# Patient Record
Sex: Female | Born: 2000
Health system: Southern US, Community
[De-identification: ages and names within clinical notes are randomized; demographics above are authoritative.]

## PROBLEM LIST (undated history)

## (undated) DIAGNOSIS — IMO0001 Reserved for inherently not codable concepts without codable children: Secondary | ICD-10-CM

## (undated) DIAGNOSIS — Z889 Allergy status to unspecified drugs, medicaments and biological substances status: Secondary | ICD-10-CM

## (undated) DIAGNOSIS — N83209 Unspecified ovarian cyst, unspecified side: Secondary | ICD-10-CM

## (undated) HISTORY — PX: NO PAST SURGERIES: SHX2092

## (undated) HISTORY — DX: Reserved for inherently not codable concepts without codable children: IMO0001

---
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
## 2020-01-04 ENCOUNTER — Emergency Department (HOSPITAL_BASED_OUTPATIENT_CLINIC_OR_DEPARTMENT_OTHER)
Admission: EM | Admit: 2020-01-04 | Discharge: 2020-01-04 | Disposition: A | Discharge status: Home / Self Care | Payer: Federal, State, Local not specified - PPO | Attending: Emergency Medicine | Admitting: Emergency Medicine

## 2020-01-04 ENCOUNTER — Other Ambulatory Visit: Payer: Self-pay

## 2020-01-04 ENCOUNTER — Encounter (HOSPITAL_BASED_OUTPATIENT_CLINIC_OR_DEPARTMENT_OTHER): Payer: Self-pay | Admitting: Emergency Medicine

## 2020-01-04 DIAGNOSIS — Z4802 Encounter for removal of sutures: Secondary | ICD-10-CM | POA: Insufficient documentation

## 2020-01-04 DIAGNOSIS — S0181XD Laceration without foreign body of other part of head, subsequent encounter: Secondary | ICD-10-CM | POA: Diagnosis not present

## 2020-01-04 DIAGNOSIS — X58XXXA Exposure to other specified factors, initial encounter: Secondary | ICD-10-CM | POA: Diagnosis not present
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
## 2020-01-04 NOTE — Discharge Instructions (Signed)
You were seen in the emergency department today to have your stitches removed.  The wound appears to be healing well.  Please keep it covered when in the sun to help prevent scarring.  You may utilize a bandage and over-the-counter Neosporin antibiotic ointment to help with this.  Please follow-up with your primary care provider within 1 week for recheck of your wound.  Return to the emergency department for new or worsening symptoms including but not limited to redness, drainage, warmth, fever, or any other concerns.

## 2020-01-04 NOTE — ED Triage Notes (Signed)
Pt requesting suture removal from chin

## 2020-04-10 DIAGNOSIS — Z68.41 Body mass index (BMI) pediatric, 5th percentile to less than 85th percentile for age: Secondary | ICD-10-CM | POA: Diagnosis not present

## 2020-04-10 DIAGNOSIS — Z713 Dietary counseling and surveillance: Secondary | ICD-10-CM | POA: Diagnosis not present

## 2020-04-10 DIAGNOSIS — Z7189 Other specified counseling: Secondary | ICD-10-CM | POA: Diagnosis not present

## 2020-04-10 DIAGNOSIS — Z00129 Encounter for routine child health examination without abnormal findings: Secondary | ICD-10-CM | POA: Diagnosis not present

## 2020-06-11 DIAGNOSIS — Z23 Encounter for immunization: Secondary | ICD-10-CM | POA: Diagnosis not present

## 2020-06-17 DIAGNOSIS — N926 Irregular menstruation, unspecified: Secondary | ICD-10-CM | POA: Diagnosis not present

## 2020-06-17 DIAGNOSIS — Z113 Encounter for screening for infections with a predominantly sexual mode of transmission: Secondary | ICD-10-CM | POA: Diagnosis not present

## 2020-08-05 DIAGNOSIS — M9903 Segmental and somatic dysfunction of lumbar region: Secondary | ICD-10-CM | POA: Diagnosis not present

## 2020-08-05 DIAGNOSIS — M62830 Muscle spasm of back: Secondary | ICD-10-CM | POA: Diagnosis not present

## 2020-08-05 DIAGNOSIS — M9913 Subluxation complex (vertebral) of lumbar region: Secondary | ICD-10-CM | POA: Diagnosis not present

## 2020-08-05 DIAGNOSIS — M545 Low back pain: Secondary | ICD-10-CM | POA: Diagnosis not present

## 2020-08-18 ENCOUNTER — Other Ambulatory Visit: Payer: Federal, State, Local not specified - PPO

## 2020-08-18 DIAGNOSIS — Z20822 Contact with and (suspected) exposure to covid-19: Secondary | ICD-10-CM | POA: Diagnosis not present
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
## 2020-08-20 DIAGNOSIS — M545 Low back pain: Secondary | ICD-10-CM | POA: Diagnosis not present

## 2020-08-20 DIAGNOSIS — M9913 Subluxation complex (vertebral) of lumbar region: Secondary | ICD-10-CM | POA: Diagnosis not present

## 2020-08-20 DIAGNOSIS — M9903 Segmental and somatic dysfunction of lumbar region: Secondary | ICD-10-CM | POA: Diagnosis not present

## 2020-08-20 DIAGNOSIS — M62830 Muscle spasm of back: Secondary | ICD-10-CM | POA: Diagnosis not present

## 2020-08-20 LAB — NOVEL CORONAVIRUS, NAA: SARS-CoV-2, NAA: NOT DETECTED

## 2020-08-20 LAB — SARS-COV-2, NAA 2 DAY TAT

## 2020-12-25 DIAGNOSIS — H18622 Keratoconus, unstable, left eye: Secondary | ICD-10-CM | POA: Diagnosis not present

## 2021-02-17 DIAGNOSIS — R1030 Lower abdominal pain, unspecified: Secondary | ICD-10-CM | POA: Diagnosis not present

## 2021-02-17 DIAGNOSIS — R112 Nausea with vomiting, unspecified: Secondary | ICD-10-CM | POA: Diagnosis not present

## 2021-02-23 DIAGNOSIS — L304 Erythema intertrigo: Secondary | ICD-10-CM | POA: Diagnosis not present

## 2021-03-23 DIAGNOSIS — L304 Erythema intertrigo: Secondary | ICD-10-CM | POA: Diagnosis not present

## 2021-03-23 DIAGNOSIS — Z3046 Encounter for surveillance of implantable subdermal contraceptive: Secondary | ICD-10-CM | POA: Diagnosis not present

## 2021-03-23 DIAGNOSIS — Z113 Encounter for screening for infections with a predominantly sexual mode of transmission: Secondary | ICD-10-CM | POA: Diagnosis not present

## 2021-03-23 DIAGNOSIS — Z01419 Encounter for gynecological examination (general) (routine) without abnormal findings: Secondary | ICD-10-CM | POA: Diagnosis not present

## 2021-04-02 DIAGNOSIS — H18621 Keratoconus, unstable, right eye: Secondary | ICD-10-CM | POA: Diagnosis not present

## 2021-04-09 DIAGNOSIS — H18622 Keratoconus, unstable, left eye: Secondary | ICD-10-CM | POA: Diagnosis not present

## 2021-07-23 DIAGNOSIS — H18621 Keratoconus, unstable, right eye: Secondary | ICD-10-CM | POA: Diagnosis not present

## 2021-11-05 ENCOUNTER — Other Ambulatory Visit: Payer: Self-pay

## 2021-11-05 ENCOUNTER — Ambulatory Visit (HOSPITAL_COMMUNITY)
Admission: EM | Admit: 2021-11-05 | Discharge: 2021-11-05 | Disposition: A | Discharge status: Home / Self Care | Payer: Federal, State, Local not specified - PPO | Attending: Physician Assistant | Admitting: Physician Assistant

## 2021-11-05 ENCOUNTER — Encounter (HOSPITAL_COMMUNITY): Payer: Self-pay

## 2021-11-05 DIAGNOSIS — N921 Excessive and frequent menstruation with irregular cycle: Secondary | ICD-10-CM | POA: Insufficient documentation

## 2021-11-05 LAB — COMPREHENSIVE METABOLIC PANEL
ALT: 11 U/L (ref 0–44)
AST: 13 U/L — ABNORMAL LOW (ref 15–41)
Albumin: 3.9 g/dL (ref 3.5–5.0)
Alkaline Phosphatase: 52 U/L (ref 38–126)
Anion gap: 5 (ref 5–15)
BUN: 6 mg/dL (ref 6–20)
CO2: 26 mmol/L (ref 22–32)
Calcium: 9.3 mg/dL (ref 8.9–10.3)
Chloride: 105 mmol/L (ref 98–111)
Creatinine, Ser: 0.88 mg/dL (ref 0.44–1.00)
GFR, Estimated: >60 mL/min (ref >60)
Glucose, Bld: 93 mg/dL (ref 70–99)
Potassium: 4.2 mmol/L (ref 3.5–5.1)
Sodium: 136 mmol/L (ref 135–145)
Total Bilirubin: 0.8 mg/dL (ref 0.3–1.2)
Total Protein: 7.2 g/dL (ref 6.5–8.1)

## 2021-11-05 LAB — CBC WITH DIFFERENTIAL/PLATELET
Abs Immature Granulocytes: 0.02 10*3/uL (ref 0.00–0.07)
Basophils Absolute: 0 10*3/uL (ref 0.0–0.1)
Basophils Relative: 0 %
Eosinophils Absolute: 0.1 10*3/uL (ref 0.0–0.5)
Eosinophils Relative: 1 %
HCT: 41.2 % (ref 36.0–46.0)
Hemoglobin: 13 g/dL (ref 12.0–15.0)
Immature Granulocytes: 0 %
Lymphocytes Relative: 20 %
Lymphs Abs: 1.6 10*3/uL (ref 0.7–4.0)
MCH: 25.7 pg — ABNORMAL LOW (ref 26.0–34.0)
MCHC: 31.6 g/dL (ref 30.0–36.0)
MCV: 81.6 fL (ref 80.0–100.0)
Monocytes Absolute: 0.7 10*3/uL (ref 0.1–1.0)
Monocytes Relative: 9 %
Neutro Abs: 5.6 10*3/uL (ref 1.7–7.7)
Neutrophils Relative %: 70 %
Platelets: 317 10*3/uL (ref 150–400)
RBC: 5.05 MIL/uL (ref 3.87–5.11)
RDW: 13.8 % (ref 11.5–15.5)
WBC: 8 10*3/uL (ref 4.0–10.5)
nRBC: 0 % (ref 0.0–0.2)

## 2021-11-05 LAB — TSH: TSH: 1.845 u[IU]/mL (ref 0.350–4.500)

## 2021-11-05 LAB — POC URINE PREG, ED: Preg Test, Ur: NEGATIVE

## 2021-11-05 NOTE — Discharge Instructions (Signed)
Your urine pregnancy test was negative.  It is not uncommon to have irregular menstrual cycles and bleeding with Nexplanon.  I recommend that you follow-up with OB/GYN as soon as possible as we discussed.  We will contact you with your lab results if we need to arrange any treatment.  If you develop any heavy bleeding where you are having to change your personal hygiene products every 1-2 hours you need to go to the emergency room.  If you develop any lightheadedness, chest pain, passing out, shortness of breath, severe fatigue you need to be seen immediately.

## 2021-11-05 NOTE — ED Provider Notes (Signed)
[facility]    CSN: [id number] Arrival date & time: 11/05/21  1330      History   Chief Complaint Chief Complaint  Patient presents with   Vaginal Bleeding    STI testing     HPI Shannon Arias is a 20 y.o. female.   Patient presents today with a 2 to 3-day history of abnormal uterine bleeding.  Reports that her last menstrual cycle was approximately 10/20/2021 and stopped 10/27/2021.  She then had several days without bleeding until recurrence a few days ago.  Reports she is bleeding heavily and having to replace pad/tampon a few times per day.  She is not bleeding to the point that she needs to replace personal hygiene products every 1-2 hours.  She does have a Nexplanon in place which has been in place for several years.  She reports having irregular menstrual cycle since placement of this device several years ago.  She denies any vaginal pain, discharge, fever, abdominal pain, nausea, vomiting.  She is interested in STI testing.  She denies any history of bleeding disorder and does not take any blood thinning medication.  She denies any chest pain, shortness of breath, lightheadedness.   Past Medical History:  Diagnosis Date   H/O seasonal allergies    Healthy pediatric patient    Ovarian cyst     Patient Active Problem List   Diagnosis Date Noted   Bell's palsy 07/29/2015    Past Surgical History:  Procedure Laterality Date   NO PAST SURGERIES      OB History   No obstetric history on file.      Home Medications    Prior to Admission medications   Medication Sig Start Date End Date Taking? Authorizing Provider  acyclovir (ZOVIRAX) 400 MG tablet Take 400 mg by mouth daily. Reported on 02/01/2016 07/24/15   Maryellen Pile, MD  Multiple Vitamin (MULTIVITAMIN) tablet Take 1 tablet by mouth daily. Reported on 02/01/2016    [provider]  ondansetron (ZOFRAN ODT) 4 MG disintegrating tablet Take 1 tablet (4 mg total) by mouth every 8 (eight) hours  as needed for nausea or vomiting. 08/30/19   Long, Arlyss Repress, MD  ranitidine (ZANTAC) 150 MG tablet Take 150 mg by mouth 2 (two) times daily. Reported on 02/01/2016 07/24/15   Maryellen Pile, MD    Family History Family History  Problem Relation Age of Onset   Seizures Maternal Grandmother    Stroke Neg Hx    Ataxia Neg Hx    Chorea Neg Hx    Dementia Neg Hx    Mental retardation Neg Hx    Migraines Neg Hx    Multiple sclerosis Neg Hx    Neurofibromatosis Neg Hx    Neuropathy Neg Hx    Parkinsonism Neg Hx     Social History Social History   Tobacco Use   Smoking status: Never   Smokeless tobacco: Never  Vaping Use   Vaping Use: Never used  Substance Use Topics   Alcohol use: No   Drug use: Yes    Types: Marijuana     Allergies   Patient has no known allergies.   Review of Systems Review of Systems  Constitutional:  Positive for activity change. Negative for appetite change, fatigue and fever.  Respiratory:  Negative for cough and shortness of breath.   Cardiovascular:  Negative for chest pain.  Gastrointestinal:  Negative for abdominal pain, diarrhea, nausea and vomiting.  Genitourinary:  Positive for menstrual  problem and vaginal bleeding. Negative for vaginal discharge and vaginal pain.  Neurological:  Negative for dizziness, light-headedness and headaches.    Physical Exam Triage Vital Signs ED Triage Vitals [11/05/21 1422]  Enc Vitals Group     BP 124/87     Pulse Rate 87     Resp 18     Temp 98.7 F (37.1 C)     Temp src      SpO2 100 %     Weight      Height      Head Circumference      Peak Flow      Pain Score      Pain Loc      Pain Edu?      Excl. in GC?    No data found.  Updated Vital Signs BP 124/87 (BP Location: Left Arm)    Pulse 87    Temp 98.7 F (37.1 C)    Resp 18    SpO2 100%   Visual Acuity Right Eye Distance:   Left Eye Distance:   Bilateral Distance:    Right Eye Near:   Left Eye Near:    Bilateral Near:     Physical  Exam Vitals reviewed.  Constitutional:      General: She is awake. She is not in acute distress.    Appearance: Normal appearance. She is well-developed. She is not ill-appearing.     Comments: Very pleasant female appears stated age in no acute distress sitting comfortably in exam room  HENT:     Head: Normocephalic and atraumatic.  Cardiovascular:     Rate and Rhythm: Normal rate and regular rhythm.     Heart sounds: Normal heart sounds, S1 normal and S2 normal. No murmur heard. Pulmonary:     Effort: Pulmonary effort is normal.     Breath sounds: Normal breath sounds. No wheezing, rhonchi or rales.     Comments: Clear to auscultation bilaterally Abdominal:     General: Bowel sounds are normal.     Palpations: Abdomen is soft.     Tenderness: There is no abdominal tenderness. There is no right CVA tenderness, left CVA tenderness, guarding or rebound.     Comments: Benign abdominal exam  Genitourinary:    Comments: Exam deferred Psychiatric:        Behavior: Behavior is cooperative.     UC Treatments / Results  Labs (all labs ordered are listed, but only abnormal results are displayed) Labs Reviewed  CBC WITH DIFFERENTIAL/PLATELET  COMPREHENSIVE METABOLIC PANEL  TSH  POC URINE PREG, ED  CERVICOVAGINAL ANCILLARY ONLY    EKG   Radiology No results found.  Procedures Procedures (including critical care time)  Medications Ordered in UC Medications - No data to display  Initial Impression / Assessment and Plan / UC Course  I have reviewed the triage vital signs and the nursing notes.  Pertinent labs & imaging results that were available during my care of the patient were reviewed by me and considered in my medical decision making (see chart for details).     Vital signs and physical exam reassuring today.  Urine pregnancy test was negative.  Discussed that symptoms are likely related to irregular bleeding due to Nexplanon but if this continues recommended that she  follow-up with OB/GYN.  She was given contact information for local provider and encouraged to call them to schedule an appointment.  CBC, CMP, TSH obtained-results pending.  Given she is not having significant heavy  bleeding will defer Megace for the time being but discussed that if she has heavy menstrual bleeding she should return and we can consider starting this medication.  Discussed at length alarm symptoms that warrant emergent evaluation including having to change her personal hygiene product every 1-2 hours, shortness of breath, chest pain, lightheadedness, dizziness, nausea/vomiting.  Strict return precautions given to which she expressed understanding.  Patient did report that she had a follow-up appointment scheduled with PCP next week but discussed that if symptoms persist would be reasonable to see OB/GYN in the meantime.  Final Clinical Impressions(s) / UC Diagnoses   Final diagnoses:  Irregular intermenstrual bleeding     Discharge Instructions      Your urine pregnancy test was negative.  It is not uncommon to have irregular menstrual cycles and bleeding with Nexplanon.  I recommend that you follow-up with OB/GYN as soon as possible as we discussed.  We will contact you with your lab results if we need to arrange any treatment.  If you develop any heavy bleeding where you are having to change your personal hygiene products every 1-2 hours you need to go to the emergency room.  If you develop any lightheadedness, chest pain, passing out, shortness of breath, severe fatigue you need to be seen immediately.     ED Prescriptions   None    PDMP not reviewed this encounter.   Jeani Hawking, PA-C 11/05/21 1506

## 2021-11-05 NOTE — ED Triage Notes (Signed)
Pt presents to the office for abnormal bleeding that started 203 days ago. Pt reports this is her second period this month and she is concerned about having a cyst. She would also like STI testing.

## 2021-11-08 ENCOUNTER — Telehealth: Payer: Self-pay | Admitting: Emergency Medicine

## 2021-11-08 ENCOUNTER — Other Ambulatory Visit: Payer: Self-pay

## 2021-11-08 ENCOUNTER — Ambulatory Visit (HOSPITAL_COMMUNITY)
Admission: EM | Admit: 2021-11-08 | Discharge: 2021-11-08 | Disposition: A | Discharge status: Home / Self Care | Payer: Federal, State, Local not specified - PPO | Attending: Internal Medicine | Admitting: Internal Medicine

## 2021-11-08 DIAGNOSIS — A549 Gonococcal infection, unspecified: Secondary | ICD-10-CM | POA: Diagnosis not present

## 2021-11-08 LAB — CERVICOVAGINAL ANCILLARY ONLY
Bacterial Vaginitis (gardnerella): POSITIVE — AB
Candida Glabrata: NEGATIVE
Candida Vaginitis: POSITIVE — AB
Chlamydia: NEGATIVE
Comment: NEGATIVE
Comment: NEGATIVE
Comment: NEGATIVE
Comment: NEGATIVE
Comment: NEGATIVE
Comment: NORMAL
Neisseria Gonorrhea: POSITIVE — AB
Trichomonas: NEGATIVE

## 2021-11-08 MED ORDER — CEFTRIAXONE SODIUM 500 MG IJ SOLR
500.0000 mg | Freq: Once | INTRAMUSCULAR | Status: AC
  Administered 2021-11-08: 16:00:00 500 mg via INTRAMUSCULAR

## 2021-11-08 MED ORDER — CEFTRIAXONE SODIUM 500 MG IJ SOLR
INTRAMUSCULAR | Status: AC
  Filled 2021-11-08: qty 500

## 2021-11-08 MED ORDER — METRONIDAZOLE 500 MG PO TABS: 500.0000 mg | ORAL_TABLET | Freq: Two times a day (BID) | ORAL | 0 refills | Status: DC

## 2021-11-08 MED ORDER — LIDOCAINE HCL (PF) 1 % IJ SOLN
INTRAMUSCULAR | Status: AC
  Filled 2021-11-08: qty 2

## 2021-11-08 MED ORDER — FLUCONAZOLE 150 MG PO TABS: 150.0000 mg | ORAL_TABLET | Freq: Once | ORAL | 0 refills | Status: AC

## 2021-11-08 NOTE — ED Triage Notes (Signed)
Pt presents for STD treatment.  °

## 2021-11-25 DIAGNOSIS — N939 Abnormal uterine and vaginal bleeding, unspecified: Secondary | ICD-10-CM | POA: Diagnosis not present

## 2021-11-25 DIAGNOSIS — Z8619 Personal history of other infectious and parasitic diseases: Secondary | ICD-10-CM | POA: Diagnosis not present

## 2022-01-24 DIAGNOSIS — H18621 Keratoconus, unstable, right eye: Secondary | ICD-10-CM | POA: Diagnosis not present

## 2022-04-05 DIAGNOSIS — H18622 Keratoconus, unstable, left eye: Secondary | ICD-10-CM | POA: Diagnosis not present

## 2022-05-04 DIAGNOSIS — K011 Impacted teeth: Secondary | ICD-10-CM | POA: Diagnosis not present

## 2022-05-16 DIAGNOSIS — K010 Embedded teeth: Secondary | ICD-10-CM | POA: Diagnosis not present

## 2022-05-16 DIAGNOSIS — K011 Impacted teeth: Secondary | ICD-10-CM | POA: Diagnosis not present

## 2022-05-16 DIAGNOSIS — K006 Disturbances in tooth eruption: Secondary | ICD-10-CM | POA: Diagnosis not present

## 2022-06-28 DIAGNOSIS — H18622 Keratoconus, unstable, left eye: Secondary | ICD-10-CM | POA: Diagnosis not present

## 2022-09-05 DIAGNOSIS — K080 Exfoliation of teeth due to systemic causes: Secondary | ICD-10-CM | POA: Diagnosis not present

## 2022-09-27 DIAGNOSIS — Z3202 Encounter for pregnancy test, result negative: Secondary | ICD-10-CM | POA: Diagnosis not present

## 2022-09-27 DIAGNOSIS — Z202 Contact with and (suspected) exposure to infections with a predominantly sexual mode of transmission: Secondary | ICD-10-CM | POA: Diagnosis not present

## 2022-10-04 DIAGNOSIS — Z3046 Encounter for surveillance of implantable subdermal contraceptive: Secondary | ICD-10-CM | POA: Diagnosis not present

## 2022-10-04 DIAGNOSIS — Z3202 Encounter for pregnancy test, result negative: Secondary | ICD-10-CM | POA: Diagnosis not present

## 2022-10-12 ENCOUNTER — Ambulatory Visit
Admission: EM | Admit: 2022-10-12 | Discharge: 2022-10-12 | Disposition: A | Discharge status: Home / Self Care | Payer: Federal, State, Local not specified - PPO | Attending: Internal Medicine | Admitting: Internal Medicine

## 2022-10-12 DIAGNOSIS — J020 Streptococcal pharyngitis: Secondary | ICD-10-CM | POA: Diagnosis not present

## 2022-10-12 LAB — POCT RAPID STREP A (OFFICE): Rapid Strep A Screen: POSITIVE — AB

## 2022-10-12 MED ORDER — AMOXICILLIN 500 MG PO CAPS: 500.0000 mg | ORAL_CAPSULE | Freq: Two times a day (BID) | ORAL | 0 refills | Status: AC

## 2022-10-12 NOTE — ED Provider Notes (Signed)
[facility]    CSN: [id number] Arrival date & time: 10/12/22  1133      History   Chief Complaint Chief Complaint  Patient presents with   Sore Throat    HPI Shannon Arias is a 21 y.o. female.   Patient presents with sore throat that has been present for 2 days.  Denies any associated upper respiratory symptoms, cough, fever.  Denies any known sick contacts.  Patient has not taken any medications to alleviate symptoms.   Sore Throat    Past Medical History:  Diagnosis Date   H/O seasonal allergies    Healthy pediatric patient    Ovarian cyst     Patient Active Problem List   Diagnosis Date Noted   Bell's palsy 07/29/2015    Past Surgical History:  Procedure Laterality Date   NO PAST SURGERIES      OB History   No obstetric history on file.      Home Medications    Prior to Admission medications   Medication Sig Start Date End Date Taking? Authorizing Provider  amoxicillin (AMOXIL) 500 MG capsule Take 1 capsule (500 mg total) by mouth 2 (two) times daily for 10 days. 10/12/22 10/22/22 Yes Martin Smeal, Acie Fredrickson, FNP  acyclovir (ZOVIRAX) 400 MG tablet Take 400 mg by mouth daily. Reported on 02/01/2016 07/24/15   Maryellen Pile, MD  metroNIDAZOLE (FLAGYL) 500 MG tablet Take 1 tablet (500 mg total) by mouth 2 (two) times daily. 11/08/21   Merrilee Jansky, MD  Multiple Vitamin (MULTIVITAMIN) tablet Take 1 tablet by mouth daily. Reported on 02/01/2016    [provider]  ondansetron (ZOFRAN ODT) 4 MG disintegrating tablet Take 1 tablet (4 mg total) by mouth every 8 (eight) hours as needed for nausea or vomiting. 08/30/19   Long, Arlyss Repress, MD  ranitidine (ZANTAC) 150 MG tablet Take 150 mg by mouth 2 (two) times daily. Reported on 02/01/2016 07/24/15   Maryellen Pile, MD    Family History Family History  Problem Relation Age of Onset   Seizures Maternal Grandmother    Stroke Neg Hx    Ataxia Neg Hx    Chorea Neg Hx    Dementia Neg Hx    Mental  retardation Neg Hx    Migraines Neg Hx    Multiple sclerosis Neg Hx    Neurofibromatosis Neg Hx    Neuropathy Neg Hx    Parkinsonism Neg Hx     Social History Social History   Tobacco Use   Smoking status: Never   Smokeless tobacco: Never  Vaping Use   Vaping Use: Never used  Substance Use Topics   Alcohol use: No   Drug use: Yes    Types: Marijuana     Allergies   Patient has no known allergies.   Review of Systems Review of Systems Per HPI  Physical Exam Triage Vital Signs ED Triage Vitals [10/12/22 1326]  Enc Vitals Group     BP 105/68     Pulse Rate 80     Resp 16     Temp 98.5 F (36.9 C)     Temp Source Oral     SpO2 98 %     Weight      Height      Head Circumference      Peak Flow      Pain Score 0     Pain Loc      Pain Edu?      Excl.  in GC?    No data found.  Updated Vital Signs BP 105/68 (BP Location: Left Arm)   Pulse 80   Temp 98.5 F (36.9 C) (Oral)   Resp 16   SpO2 98%   Visual Acuity Right Eye Distance:   Left Eye Distance:   Bilateral Distance:    Right Eye Near:   Left Eye Near:    Bilateral Near:     Physical Exam Constitutional:      General: She is not in acute distress.    Appearance: Normal appearance. She is not toxic-appearing or diaphoretic.  HENT:     Head: Normocephalic and atraumatic.     Right Ear: Tympanic membrane and ear canal normal.     Left Ear: Tympanic membrane and ear canal normal.     Nose: No congestion.     Mouth/Throat:     Mouth: Mucous membranes are moist.     Pharynx: Posterior oropharyngeal erythema present. No pharyngeal swelling, oropharyngeal exudate or uvula swelling.     Tonsils: No tonsillar exudate or tonsillar abscesses.  Eyes:     Extraocular Movements: Extraocular movements intact.     Conjunctiva/sclera: Conjunctivae normal.     Pupils: Pupils are equal, round, and reactive to light.  Cardiovascular:     Rate and Rhythm: Normal rate and regular rhythm.     Pulses: Normal  pulses.     Heart sounds: Normal heart sounds.  Pulmonary:     Effort: Pulmonary effort is normal. No respiratory distress.     Breath sounds: Normal breath sounds. No stridor. No wheezing, rhonchi or rales.  Abdominal:     General: Abdomen is flat. Bowel sounds are normal.     Palpations: Abdomen is soft.  Musculoskeletal:        General: Normal range of motion.     Cervical back: Normal range of motion.  Skin:    General: Skin is warm and dry.  Neurological:     General: No focal deficit present.     Mental Status: She is alert and oriented to person, place, and time. Mental status is at baseline.  Psychiatric:        Mood and Affect: Mood normal.        Behavior: Behavior normal.      UC Treatments / Results  Labs (all labs ordered are listed, but only abnormal results are displayed) Labs Reviewed  POCT RAPID STREP A (OFFICE) - Abnormal; Notable for the following components:      Result Value   Rapid Strep A Screen Positive (*)    All other components within normal limits    EKG   Radiology No results found.  Procedures Procedures (including critical care time)  Medications Ordered in UC Medications - No data to display  Initial Impression / Assessment and Plan / UC Course  I have reviewed the triage vital signs and the nursing notes.  Pertinent labs & imaging results that were available during my care of the patient were reviewed by me and considered in my medical decision making (see chart for details).     Rapid strep was positive.  Will treat with amoxicillin.  No signs of peritonsillar abscess on exam.  Discussed supportive care and symptom management with patient.  Discussed return precautions.  Patient verbalized understanding and was agreeable with plan. Final Clinical Impressions(s) / UC Diagnoses   Final diagnoses:  Strep pharyngitis     Discharge Instructions      You have strep  throat which is being treated with an antibiotic that I have  sent to the pharmacy.  Please follow-up if symptoms persist or worsen.     ED Prescriptions     Medication Sig Dispense Auth. Provider   amoxicillin (AMOXIL) 500 MG capsule Take 1 capsule (500 mg total) by mouth 2 (two) times daily for 10 days. 20 capsule Gustavus Bryant, [place]      PDMP not reviewed this encounter.   Gustavus Bryant, [place] 10/12/22 1413

## 2022-10-12 NOTE — ED Triage Notes (Signed)
Pt c/o scratchy throat for 2 days

## 2022-10-12 NOTE — Discharge Instructions (Signed)
You have strep throat which is being treated with an antibiotic that I have sent to the pharmacy.  Please follow-up if symptoms persist or worsen.

## 2022-12-21 DIAGNOSIS — Z7251 High risk heterosexual behavior: Secondary | ICD-10-CM | POA: Diagnosis not present

## 2022-12-21 DIAGNOSIS — Z3046 Encounter for surveillance of implantable subdermal contraceptive: Secondary | ICD-10-CM | POA: Diagnosis not present

## 2023-02-21 DIAGNOSIS — H18621 Keratoconus, unstable, right eye: Secondary | ICD-10-CM | POA: Diagnosis not present

## 2023-02-21 DIAGNOSIS — Z7251 High risk heterosexual behavior: Secondary | ICD-10-CM | POA: Diagnosis not present

## 2023-02-21 DIAGNOSIS — Z3202 Encounter for pregnancy test, result negative: Secondary | ICD-10-CM | POA: Diagnosis not present

## 2023-03-02 ENCOUNTER — Ambulatory Visit (HOSPITAL_COMMUNITY)
Admission: EM | Admit: 2023-03-02 | Discharge: 2023-03-02 | Disposition: A | Discharge status: Home / Self Care | Payer: Federal, State, Local not specified - PPO | Attending: Internal Medicine | Admitting: Internal Medicine

## 2023-03-02 ENCOUNTER — Encounter (HOSPITAL_COMMUNITY): Payer: Self-pay

## 2023-03-02 DIAGNOSIS — Z3202 Encounter for pregnancy test, result negative: Secondary | ICD-10-CM | POA: Diagnosis not present

## 2023-03-02 DIAGNOSIS — Z113 Encounter for screening for infections with a predominantly sexual mode of transmission: Secondary | ICD-10-CM

## 2023-03-02 DIAGNOSIS — Z202 Contact with and (suspected) exposure to infections with a predominantly sexual mode of transmission: Secondary | ICD-10-CM | POA: Diagnosis present

## 2023-03-02 LAB — POC URINE PREG, ED: Preg Test, Ur: NEGATIVE

## 2023-03-02 LAB — HIV ANTIBODY (ROUTINE TESTING W REFLEX): HIV Screen 4th Generation wRfx: NONREACTIVE

## 2023-03-02 MED ORDER — DOXYCYCLINE HYCLATE 100 MG PO CAPS: 100.0000 mg | ORAL_CAPSULE | Freq: Two times a day (BID) | ORAL | 0 refills | Status: AC

## 2023-03-02 MED ORDER — METRONIDAZOLE 500 MG PO TABS: 500.0000 mg | ORAL_TABLET | Freq: Two times a day (BID) | ORAL | 0 refills | Status: AC

## 2023-03-02 NOTE — ED Provider Notes (Signed)
[facility]    CSN: [id number] Arrival date & time: 03/02/23  0909      History   Chief Complaint No chief complaint on file.   HPI Shannon Arias is a 22 y.o. female.   Patient presents today for concern for STD exposure.  Patient reports that she had sexual intercourse that was unprotected approximately 2 weeks ago with a female sexual partner who advised her that he tested positive for chlamydia, trichomonas, and genital herpes.  Denies any symptoms including vaginal discharge, dysuria, urinary frequency, abdominal pain, back pain, pelvic pain, fever.  She also denies any genital lesions.  Last menstrual cycle was 3/10.     Past Medical History:  Diagnosis Date   H/O seasonal allergies    Healthy pediatric patient    Ovarian cyst     Patient Active Problem List   Diagnosis Date Noted   Bell's palsy 07/29/2015    Past Surgical History:  Procedure Laterality Date   NO PAST SURGERIES      OB History   No obstetric history on file.      Home Medications    Prior to Admission medications   Medication Sig Start Date End Date Taking? Authorizing Provider  doxycycline (VIBRAMYCIN) 100 MG capsule Take 1 capsule (100 mg total) by mouth 2 (two) times daily for 7 days. 03/02/23 03/09/23 Yes Sivan Cuello, Acie FredricksonHaley E, FNP  metroNIDAZOLE (FLAGYL) 500 MG tablet Take 1 tablet (500 mg total) by mouth 2 (two) times daily. 03/02/23  Yes Kathlene Yano, Acie FredricksonHaley E, FNP  acyclovir (ZOVIRAX) 400 MG tablet Take 400 mg by mouth daily. Reported on 02/01/2016 07/24/15   Maryellen Pileubin, David, MD  Multiple Vitamin (MULTIVITAMIN) tablet Take 1 tablet by mouth daily. Reported on 02/01/2016    [provider]  ondansetron (ZOFRAN ODT) 4 MG disintegrating tablet Take 1 tablet (4 mg total) by mouth every 8 (eight) hours as needed for nausea or vomiting. 08/30/19   Long, Arlyss RepressJoshua G, MD  ranitidine (ZANTAC) 150 MG tablet Take 150 mg by mouth 2 (two) times daily. Reported on 02/01/2016 07/24/15   Maryellen Pileubin, David, MD     Family History Family History  Problem Relation Age of Onset   Healthy Mother    Healthy Father    Seizures Maternal Grandmother    Stroke Neg Hx    Ataxia Neg Hx    Chorea Neg Hx    Dementia Neg Hx    Mental retardation Neg Hx    Migraines Neg Hx    Multiple sclerosis Neg Hx    Neurofibromatosis Neg Hx    Neuropathy Neg Hx    Parkinsonism Neg Hx     Social History Social History   Tobacco Use   Smoking status: Never   Smokeless tobacco: Never  Vaping Use   Vaping Use: Never used  Substance Use Topics   Alcohol use: No   Drug use: Yes    Types: Marijuana     Allergies   Patient has no known allergies.   Review of Systems Review of Systems Per HPI  Physical Exam Triage Vital Signs ED Triage Vitals  Enc Vitals Group     BP 03/02/23 0952 104/70     Pulse Rate 03/02/23 0952 69     Resp 03/02/23 0952 14     Temp 03/02/23 0952 98.8 F (37.1 C)     Temp Source 03/02/23 0952 Oral     SpO2 03/02/23 0952 97 %     Weight --  Height --      Head Circumference --      Peak Flow --      Pain Score 03/02/23 0953 0     Pain Loc --      Pain Edu? --      Excl. in GC? --    No data found.  Updated Vital Signs BP 104/70 (BP Location: Left Arm)   Pulse 69   Temp 98.8 F (37.1 C) (Oral)   Resp 14   LMP 01/27/2023 (Approximate)   SpO2 97%   Visual Acuity Right Eye Distance:   Left Eye Distance:   Bilateral Distance:    Right Eye Near:   Left Eye Near:    Bilateral Near:     Physical Exam Constitutional:      General: She is not in acute distress.    Appearance: Normal appearance. She is not toxic-appearing or diaphoretic.  HENT:     Head: Normocephalic and atraumatic.  Eyes:     Extraocular Movements: Extraocular movements intact.     Conjunctiva/sclera: Conjunctivae normal.  Pulmonary:     Effort: Pulmonary effort is normal.  Genitourinary:    Comments: Deferred with shared decision making. Self swab performed.  Neurological:      General: No focal deficit present.     Mental Status: She is alert and oriented to person, place, and time. Mental status is at baseline.  Psychiatric:        Mood and Affect: Mood normal.        Behavior: Behavior normal.        Thought Content: Thought content normal.        Judgment: Judgment normal.      UC Treatments / Results  Labs (all labs ordered are listed, but only abnormal results are displayed) Labs Reviewed  HIV ANTIBODY (ROUTINE TESTING W REFLEX)  RPR  POC URINE PREG, ED  CERVICOVAGINAL ANCILLARY ONLY    EKG   Radiology No results found.  Procedures Procedures (including critical care time)  Medications Ordered in UC Medications - No data to display  Initial Impression / Assessment and Plan / UC Course  I have reviewed the triage vital signs and the nursing notes.  Pertinent labs & imaging results that were available during my care of the patient were reviewed by me and considered in my medical decision making (see chart for details).     Will treat with doxycycline and metronidazole given chlamydia and trichomoniasis exposure.  Cervicovaginal swab pending.  Awaiting results for any further treatment.  Patient does not have any genital lesions so educated patient that there has to be genital lesions for [place] to test for active genital herpes infection.  Advised patient that we do not typically do HSV blood work here in urgent care.  Encouraged her to follow-up with family doctor for any further concerns of this.  She was encouraged to follow-up with urgent care if any genital lesions develop as we can test them at that time.  Patient requesting HIV and syphilis testing so this is pending.  Urine pregnancy test was negative.  Advised strict return precautions and refraining from sexual activity until test results and treatment are complete.  Patient verbalized understanding and was agreeable with plan. Final Clinical Impressions(s) / UC Diagnoses   Final  diagnoses:  Screening examination for venereal disease  Exposure to chlamydia  Exposure to trichomonas  Exposure to genital herpes  Urine pregnancy test negative     Discharge Instructions  Your urine pregnancy test was negative.  I am treating you for chlamydia and trichomoniasis exposure with doxycycline and metronidazole antibiotic. Please take these medications with food and avoid alcohol.  Follow-up with family medicine doctor as well.  Vaginal swab is pending as well as blood work.  We will call if anything is abnormal and treat as appropriate.  We ask that you refrain from sexual activity until test results and treatment are complete.     ED Prescriptions     Medication Sig Dispense Auth. Provider   doxycycline (VIBRAMYCIN) 100 MG capsule Take 1 capsule (100 mg total) by mouth 2 (two) times daily for 7 days. 14 capsule Harrison, Glide E, [place]   metroNIDAZOLE (FLAGYL) 500 MG tablet Take 1 tablet (500 mg total) by mouth 2 (two) times daily. 14 tablet [place], Acie Fredrickson, [place]      PDMP not reviewed this encounter.   Gustavus Bryant, [place] 03/02/23 1104

## 2023-03-02 NOTE — Discharge Instructions (Addendum)
Your urine pregnancy test was negative.  I am treating you for chlamydia and trichomoniasis exposure with doxycycline and metronidazole antibiotic. Please take these medications with food and avoid alcohol.  Follow-up with family medicine doctor as well.  Vaginal swab is pending as well as blood work.  We will call if anything is abnormal and treat as appropriate.  We ask that you refrain from sexual activity until test results and treatment are complete.

## 2023-03-02 NOTE — ED Triage Notes (Signed)
Patient reports a previous partner  reports tht he was positive for chlamydia, trich, and Herpes.  Patient denies any symptoms.

## 2023-03-03 LAB — RPR: RPR Ser Ql: NONREACTIVE

## 2023-03-07 LAB — CERVICOVAGINAL ANCILLARY ONLY
Bacterial Vaginitis (gardnerella): POSITIVE — AB
Candida Glabrata: NEGATIVE
Candida Vaginitis: NEGATIVE
Chlamydia: NEGATIVE
Comment: NEGATIVE
Comment: NEGATIVE
Comment: NEGATIVE
Comment: NEGATIVE
Comment: NEGATIVE
Comment: NORMAL
Neisseria Gonorrhea: NEGATIVE
Trichomonas: NEGATIVE

## 2023-03-14 DIAGNOSIS — Z7251 High risk heterosexual behavior: Secondary | ICD-10-CM | POA: Diagnosis not present

## 2023-03-14 DIAGNOSIS — Z124 Encounter for screening for malignant neoplasm of cervix: Secondary | ICD-10-CM | POA: Diagnosis not present

## 2023-03-14 DIAGNOSIS — Z01419 Encounter for gynecological examination (general) (routine) without abnormal findings: Secondary | ICD-10-CM | POA: Diagnosis not present

## 2023-03-14 DIAGNOSIS — Z20828 Contact with and (suspected) exposure to other viral communicable diseases: Secondary | ICD-10-CM | POA: Diagnosis not present

## 2023-04-25 DIAGNOSIS — R059 Cough, unspecified: Secondary | ICD-10-CM | POA: Diagnosis not present

## 2023-04-25 DIAGNOSIS — R0981 Nasal congestion: Secondary | ICD-10-CM | POA: Diagnosis not present

## 2023-06-30 DIAGNOSIS — H18622 Keratoconus, unstable, left eye: Secondary | ICD-10-CM | POA: Diagnosis not present

## 2023-07-18 DIAGNOSIS — H18622 Keratoconus, unstable, left eye: Secondary | ICD-10-CM | POA: Diagnosis not present

## 2023-10-24 DIAGNOSIS — M79672 Pain in left foot: Secondary | ICD-10-CM | POA: Diagnosis not present

## 2023-10-24 DIAGNOSIS — S92515A Nondisplaced fracture of proximal phalanx of left lesser toe(s), initial encounter for closed fracture: Secondary | ICD-10-CM | POA: Diagnosis not present

## 2023-11-21 DIAGNOSIS — Z113 Encounter for screening for infections with a predominantly sexual mode of transmission: Secondary | ICD-10-CM | POA: Diagnosis not present

## 2023-11-21 DIAGNOSIS — N9489 Other specified conditions associated with female genital organs and menstrual cycle: Secondary | ICD-10-CM | POA: Diagnosis not present

## 2024-03-19 DIAGNOSIS — Z01419 Encounter for gynecological examination (general) (routine) without abnormal findings: Secondary | ICD-10-CM | POA: Diagnosis not present
